# Patient Record
Sex: Male | Born: 1975 | Race: White | Hispanic: No | Marital: Married | Smoking: Former smoker
Health system: Southern US, Community
[De-identification: ages and names within clinical notes are randomized; demographics above are authoritative.]

## PROBLEM LIST (undated history)

## (undated) DIAGNOSIS — F411 Generalized anxiety disorder: Secondary | ICD-10-CM

## (undated) DIAGNOSIS — K219 Gastro-esophageal reflux disease without esophagitis: Secondary | ICD-10-CM

## (undated) HISTORY — PX: TONSILLECTOMY: SUR1361

## (undated) HISTORY — PX: APPENDECTOMY: SHX54

---

## 2012-01-23 ENCOUNTER — Emergency Department (HOSPITAL_COMMUNITY)
Admission: EM | Admit: 2012-01-23 | Discharge: 2012-01-23 | Disposition: A | Discharge status: Home / Self Care | Payer: BC Managed Care – PPO | Source: Home / Self Care

## 2012-01-23 ENCOUNTER — Encounter (HOSPITAL_COMMUNITY): Payer: Self-pay | Admitting: *Deleted

## 2012-01-23 DIAGNOSIS — R1013 Epigastric pain: Secondary | ICD-10-CM

## 2012-01-23 HISTORY — DX: Generalized anxiety disorder: F41.1

## 2012-01-23 HISTORY — DX: Gastro-esophageal reflux disease without esophagitis: K21.9

## 2012-01-23 NOTE — ED Provider Notes (Signed)
History     CSN: [id number]  Arrival date & time 01/23/12  1937   None     Chief Complaint  Patient presents with  . Abdominal Pain    (Consider location/radiation/quality/duration/timing/severity/associated sxs/prior treatment) HPI Comments: Pleasant 36 year old male who has been experiencing sharp abdominal pain for 2 days. It is intermittent it may last anywhere between 15 and 20 minutes to 30 minutes or an hour. He states time varies is often worse after eating. He has mild nausea but no vomiting he does have a history of reflux and takes Nexium. Heating light foods though and fat tends to cause no pain or less discomfort. However when heating at [employer] this caused a great amount of epigastric pain. The pain is sudden sharp in nature it is located in the epigastrium with slight radiation to the left. When the pain abates he feels well. He denies having fever chills, malaise. He does admit to having loose stools in the past couple of days, approximately 6 yesterday in 5-6 loose stools today. He is completely asymptomatic at this time and appears quite well.   Past Medical History  Diagnosis Date  . GERD (gastroesophageal reflux disease)   . Anxiety, generalized     Past Surgical History  Procedure Date  . Appendectomy   . Tonsillectomy     Family History  Problem Relation Age of Onset  . [name] Parkinson White syndrome Brother   . Heart attack Other     History  Substance Use Topics  . Smoking status: Former Games developer  . Smokeless tobacco: Not on file  . Alcohol Use: Yes     Comment: occasional      Review of Systems  Constitutional: Negative.   HENT: Negative.   Respiratory: Negative.   Cardiovascular: Negative.   Gastrointestinal:       As per history of present illness  Genitourinary: Negative.   Musculoskeletal: Negative.   Skin: Negative.   Neurological: Negative.   Psychiatric/Behavioral: Negative.     Allergies  Review of patient's allergies  indicates no known allergies.  Home Medications   Current Outpatient Rx  Name  Route  Sig  Dispense  Refill  . ESCITALOPRAM OXALATE 10 MG PO TABS   Oral   Take 10 mg by mouth daily.         Marland Kitchen ESOMEPRAZOLE MAGNESIUM 40 MG PO CPDR   Oral   Take 40 mg by mouth daily before breakfast.         . LORATADINE 10 MG PO CAPS   Oral   Take by mouth.         . MENS MULTIVITAMIN PLUS PO TABS   Oral   Take by mouth.           BP 130/85  Pulse 86  Temp 98.2 F (36.8 C) (Oral)  Resp 16  SpO2 97%  Physical Exam  Constitutional: He is oriented to person, place, and time. He appears well-developed and well-nourished. No distress.  Eyes: Conjunctivae normal and EOM are normal.  Neck: Normal range of motion. Neck supple.  Cardiovascular: Normal rate and normal heart sounds.   Pulmonary/Chest: Effort normal. No respiratory distress. He has no wheezes.  Abdominal: Soft. He exhibits no distension and no mass. There is tenderness. There is no rebound and no guarding.       Percussion reveals tympany and hyperresonance. There is moderate tenderness over the epigastrium and right upper quadrant. Negative Murphy sign  Musculoskeletal: He exhibits no edema and  no tenderness.  Neurological: He is alert and oriented to person, place, and time.  Skin: Skin is warm and dry. No rash noted. No erythema.  Psychiatric: He has a normal mood and affect.    ED Course  Procedures (including critical care time)  Labs Reviewed - No data to display No results found.   1. Epigastric abdominal pain       MDM  Differential diagnosis would be gastritis or, cholecystitis, biliary dyskinesia or obstruction,.. pancreatitis would be less likely. He is completely asymptomatic at this time he is instructed to eat foods low-fat and in the meantime if he develops severe pain he is to go to the emergency department. He will also call his PCP tomorrow to have him referred to either a gastroenterologist or  have an abdominal ultrasound. Continue taking her Nexium daily, he was discharged in a stable and asymptomatic condition.          Hayden Rasmussen, NP 01/23/12 2228

## 2012-01-23 NOTE — ED Notes (Signed)
C/o mid upper abdominal pain onset Tues. 11/12. Pain worse after eating.  Pain comes and goes and  He rates it 7/10.  Has had some nausea but no vomiting.  Had loose bowel movements x 6 yesterday  and 4 x today.   No chills or fever.  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

## 2012-01-24 NOTE — ED Provider Notes (Signed)
Medical screening examination/treatment/procedure(s) were performed by resident physician or non-physician practitioner and as supervising physician I was immediately available for consultation/collaboration.   Barkley Bruns MD.    Linna Hoff, MD 01/24/12 [phone]

## 2017-07-07 ENCOUNTER — Other Ambulatory Visit: Payer: Self-pay | Admitting: Gastroenterology

## 2017-07-07 DIAGNOSIS — K219 Gastro-esophageal reflux disease without esophagitis: Secondary | ICD-10-CM | POA: Diagnosis not present

## 2017-07-07 DIAGNOSIS — R1010 Upper abdominal pain, unspecified: Secondary | ICD-10-CM | POA: Diagnosis not present

## 2017-07-17 ENCOUNTER — Ambulatory Visit
Admission: RE | Admit: 2017-07-17 | Discharge: 2017-07-17 | Disposition: A | Discharge status: Home / Self Care | Payer: Self-pay | Source: Ambulatory Visit | Attending: Gastroenterology | Admitting: Gastroenterology

## 2017-07-17 DIAGNOSIS — R1010 Upper abdominal pain, unspecified: Secondary | ICD-10-CM

## 2017-08-01 DIAGNOSIS — K319 Disease of stomach and duodenum, unspecified: Secondary | ICD-10-CM | POA: Diagnosis not present

## 2017-08-01 DIAGNOSIS — K219 Gastro-esophageal reflux disease without esophagitis: Secondary | ICD-10-CM | POA: Diagnosis not present

## 2017-08-01 DIAGNOSIS — K449 Diaphragmatic hernia without obstruction or gangrene: Secondary | ICD-10-CM | POA: Diagnosis not present

## 2017-08-06 DIAGNOSIS — K319 Disease of stomach and duodenum, unspecified: Secondary | ICD-10-CM | POA: Diagnosis not present

## 2017-08-06 DIAGNOSIS — K219 Gastro-esophageal reflux disease without esophagitis: Secondary | ICD-10-CM | POA: Diagnosis not present

## 2018-03-24 DIAGNOSIS — F411 Generalized anxiety disorder: Secondary | ICD-10-CM | POA: Diagnosis not present

## 2018-03-31 DIAGNOSIS — Z1389 Encounter for screening for other disorder: Secondary | ICD-10-CM | POA: Diagnosis not present

## 2018-03-31 DIAGNOSIS — R7301 Impaired fasting glucose: Secondary | ICD-10-CM | POA: Diagnosis not present

## 2018-03-31 DIAGNOSIS — R7309 Other abnormal glucose: Secondary | ICD-10-CM | POA: Diagnosis not present

## 2018-03-31 DIAGNOSIS — Z0000 Encounter for general adult medical examination without abnormal findings: Secondary | ICD-10-CM | POA: Diagnosis not present

## 2018-03-31 DIAGNOSIS — E663 Overweight: Secondary | ICD-10-CM | POA: Diagnosis not present

## 2018-03-31 DIAGNOSIS — K219 Gastro-esophageal reflux disease without esophagitis: Secondary | ICD-10-CM | POA: Diagnosis not present

## 2018-03-31 DIAGNOSIS — F329 Major depressive disorder, single episode, unspecified: Secondary | ICD-10-CM | POA: Diagnosis not present

## 2018-03-31 DIAGNOSIS — Z6829 Body mass index (BMI) 29.0-29.9, adult: Secondary | ICD-10-CM | POA: Diagnosis not present

## 2018-04-02 DIAGNOSIS — F411 Generalized anxiety disorder: Secondary | ICD-10-CM | POA: Diagnosis not present

## 2018-04-07 DIAGNOSIS — F411 Generalized anxiety disorder: Secondary | ICD-10-CM | POA: Diagnosis not present

## 2018-04-14 DIAGNOSIS — F411 Generalized anxiety disorder: Secondary | ICD-10-CM | POA: Diagnosis not present

## 2018-04-23 DIAGNOSIS — F411 Generalized anxiety disorder: Secondary | ICD-10-CM | POA: Diagnosis not present

## 2018-04-29 DIAGNOSIS — F411 Generalized anxiety disorder: Secondary | ICD-10-CM | POA: Diagnosis not present

## 2018-05-04 DIAGNOSIS — F411 Generalized anxiety disorder: Secondary | ICD-10-CM | POA: Diagnosis not present

## 2018-05-12 DIAGNOSIS — F411 Generalized anxiety disorder: Secondary | ICD-10-CM | POA: Diagnosis not present

## 2018-05-18 DIAGNOSIS — F411 Generalized anxiety disorder: Secondary | ICD-10-CM | POA: Diagnosis not present

## 2018-05-26 DIAGNOSIS — F411 Generalized anxiety disorder: Secondary | ICD-10-CM | POA: Diagnosis not present

## 2018-06-02 DIAGNOSIS — F411 Generalized anxiety disorder: Secondary | ICD-10-CM | POA: Diagnosis not present

## 2018-06-09 DIAGNOSIS — F411 Generalized anxiety disorder: Secondary | ICD-10-CM | POA: Diagnosis not present

## 2018-06-16 DIAGNOSIS — F411 Generalized anxiety disorder: Secondary | ICD-10-CM | POA: Diagnosis not present

## 2018-06-23 DIAGNOSIS — F411 Generalized anxiety disorder: Secondary | ICD-10-CM | POA: Diagnosis not present

## 2018-06-26 DIAGNOSIS — K219 Gastro-esophageal reflux disease without esophagitis: Secondary | ICD-10-CM | POA: Diagnosis not present

## 2018-06-26 DIAGNOSIS — I10 Essential (primary) hypertension: Secondary | ICD-10-CM | POA: Diagnosis not present

## 2018-06-26 DIAGNOSIS — Z6829 Body mass index (BMI) 29.0-29.9, adult: Secondary | ICD-10-CM | POA: Diagnosis not present

## 2018-06-26 DIAGNOSIS — E663 Overweight: Secondary | ICD-10-CM | POA: Diagnosis not present

## 2018-06-26 DIAGNOSIS — Z1389 Encounter for screening for other disorder: Secondary | ICD-10-CM | POA: Diagnosis not present

## 2018-06-30 DIAGNOSIS — F411 Generalized anxiety disorder: Secondary | ICD-10-CM | POA: Diagnosis not present

## 2018-07-07 DIAGNOSIS — F411 Generalized anxiety disorder: Secondary | ICD-10-CM | POA: Diagnosis not present

## 2018-07-14 DIAGNOSIS — F411 Generalized anxiety disorder: Secondary | ICD-10-CM | POA: Diagnosis not present

## 2018-07-21 DIAGNOSIS — F411 Generalized anxiety disorder: Secondary | ICD-10-CM | POA: Diagnosis not present

## 2018-07-28 DIAGNOSIS — F411 Generalized anxiety disorder: Secondary | ICD-10-CM | POA: Diagnosis not present

## 2018-08-13 DIAGNOSIS — F411 Generalized anxiety disorder: Secondary | ICD-10-CM | POA: Diagnosis not present

## 2018-08-24 DIAGNOSIS — F411 Generalized anxiety disorder: Secondary | ICD-10-CM | POA: Diagnosis not present

## 2018-09-07 DIAGNOSIS — F411 Generalized anxiety disorder: Secondary | ICD-10-CM | POA: Diagnosis not present

## 2018-09-22 DIAGNOSIS — Z20828 Contact with and (suspected) exposure to other viral communicable diseases: Secondary | ICD-10-CM | POA: Diagnosis not present

## 2018-10-01 DIAGNOSIS — F411 Generalized anxiety disorder: Secondary | ICD-10-CM | POA: Diagnosis not present

## 2018-10-08 DIAGNOSIS — F411 Generalized anxiety disorder: Secondary | ICD-10-CM | POA: Diagnosis not present

## 2018-10-19 DIAGNOSIS — F411 Generalized anxiety disorder: Secondary | ICD-10-CM | POA: Diagnosis not present

## 2018-10-27 DIAGNOSIS — I10 Essential (primary) hypertension: Secondary | ICD-10-CM | POA: Diagnosis not present

## 2018-10-27 DIAGNOSIS — F329 Major depressive disorder, single episode, unspecified: Secondary | ICD-10-CM | POA: Diagnosis not present

## 2018-11-05 DIAGNOSIS — F411 Generalized anxiety disorder: Secondary | ICD-10-CM | POA: Diagnosis not present

## 2018-11-23 DIAGNOSIS — F411 Generalized anxiety disorder: Secondary | ICD-10-CM | POA: Diagnosis not present

## 2018-12-07 DIAGNOSIS — F411 Generalized anxiety disorder: Secondary | ICD-10-CM | POA: Diagnosis not present

## 2018-12-14 DIAGNOSIS — F411 Generalized anxiety disorder: Secondary | ICD-10-CM | POA: Diagnosis not present

## 2018-12-31 DIAGNOSIS — F411 Generalized anxiety disorder: Secondary | ICD-10-CM | POA: Diagnosis not present

## 2019-01-15 ENCOUNTER — Other Ambulatory Visit: Payer: Self-pay

## 2019-01-15 DIAGNOSIS — Z20822 Contact with and (suspected) exposure to covid-19: Secondary | ICD-10-CM

## 2019-01-15 DIAGNOSIS — F411 Generalized anxiety disorder: Secondary | ICD-10-CM | POA: Diagnosis not present

## 2019-01-16 LAB — NOVEL CORONAVIRUS, NAA: SARS-CoV-2, NAA: NOT DETECTED

## 2019-01-20 ENCOUNTER — Other Ambulatory Visit: Payer: Self-pay

## 2019-01-20 DIAGNOSIS — Z20822 Contact with and (suspected) exposure to covid-19: Secondary | ICD-10-CM

## 2019-01-22 LAB — NOVEL CORONAVIRUS, NAA: SARS-CoV-2, NAA: DETECTED — AB

## 2019-01-25 DIAGNOSIS — F411 Generalized anxiety disorder: Secondary | ICD-10-CM | POA: Diagnosis not present

## 2019-02-11 DIAGNOSIS — F411 Generalized anxiety disorder: Secondary | ICD-10-CM | POA: Diagnosis not present

## 2019-02-22 IMAGING — US US ABDOMEN LIMITED
1 series · 14 of 25 positions shown · non-contrast
Comparison: None.

CLINICAL DATA: Upper abdominal pain for 2 months.

EXAM:
ULTRASOUND ABDOMEN LIMITED RIGHT UPPER QUADRANT

[Series 1: us abdomen limited · 0.22mm/px · 14 of 45 slices shown]
[im 1/45]
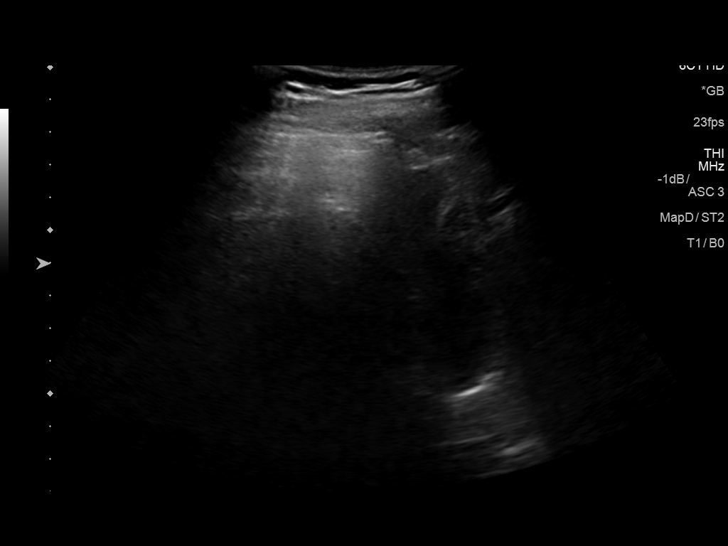
[im 4/45]
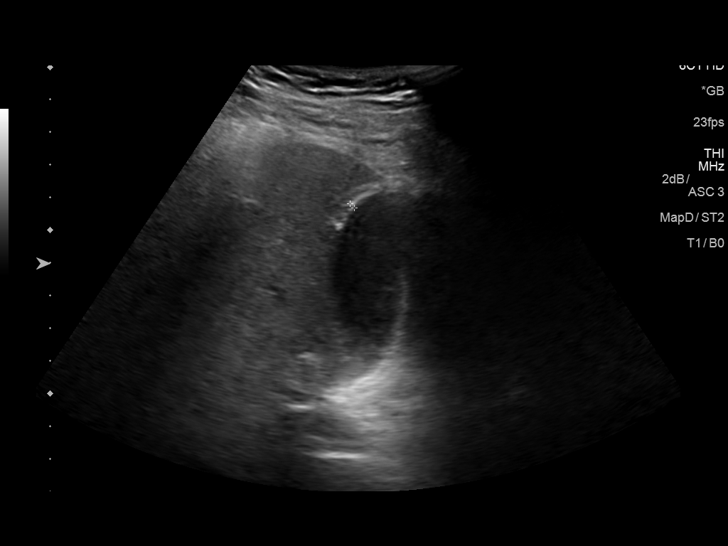
[im 8/45]
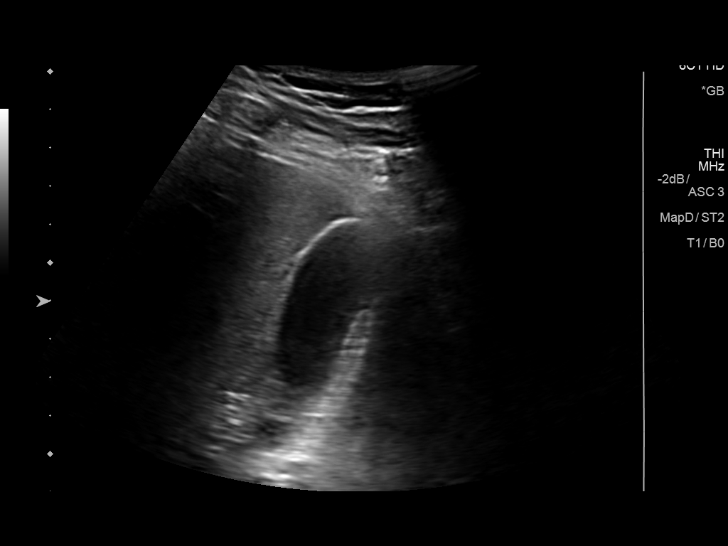
[im 12/45]
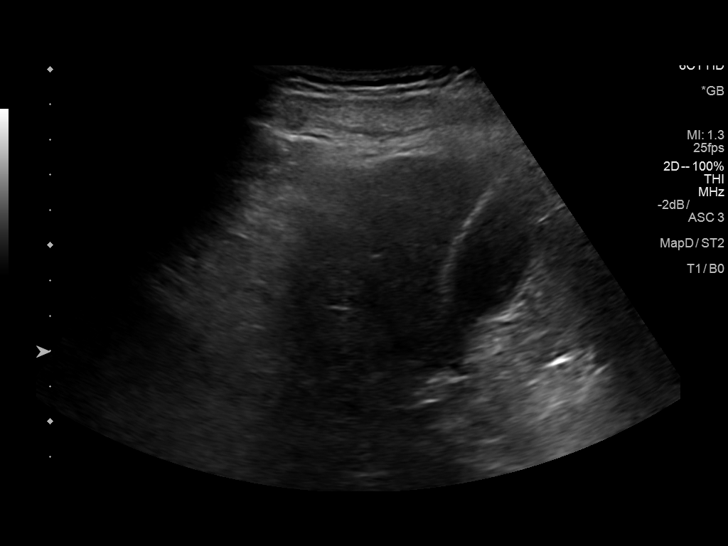
[im 15/45]
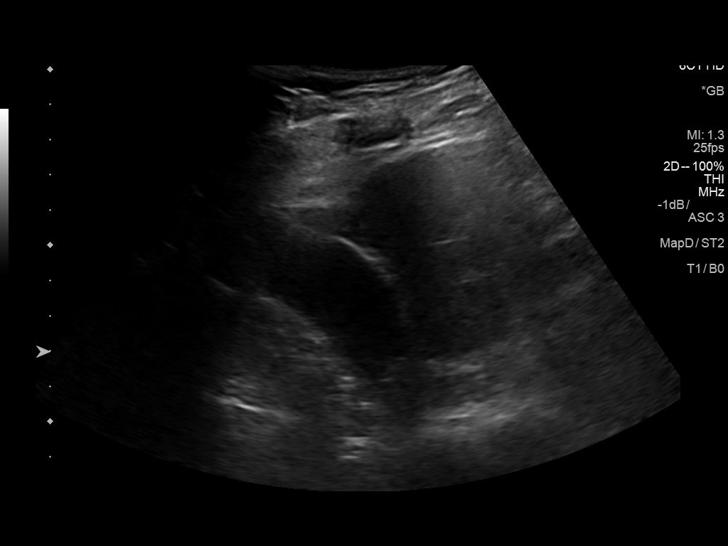
[im 17/45]
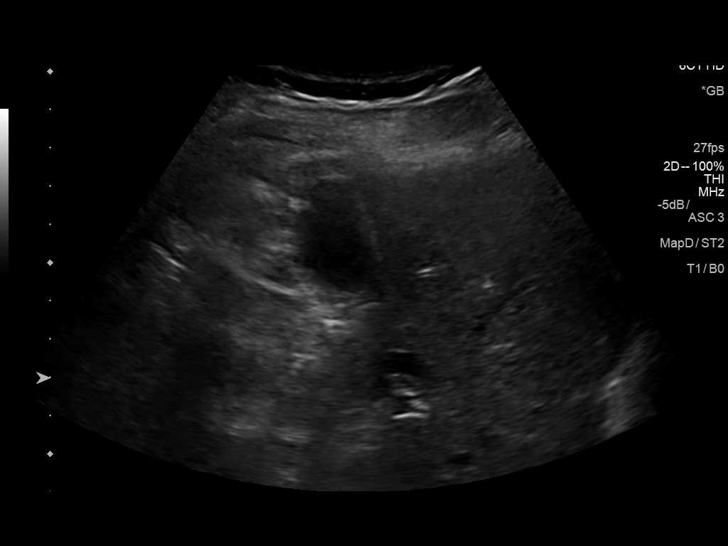
[im 21/45]
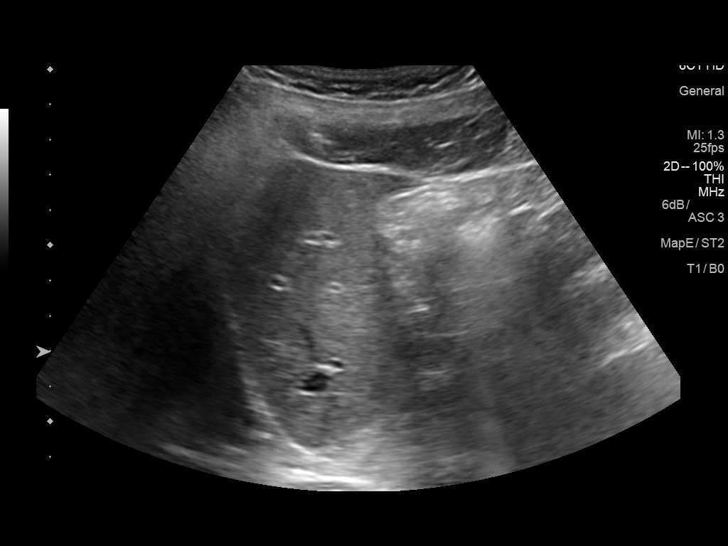
[im 24/45]
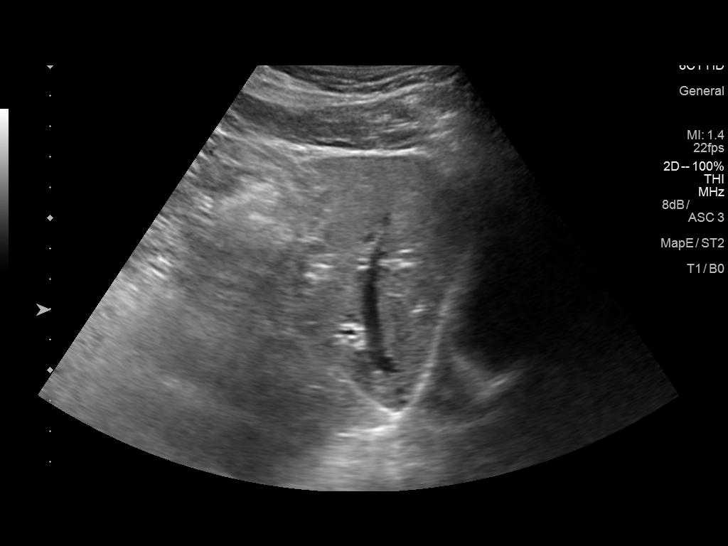
[im 28/45]
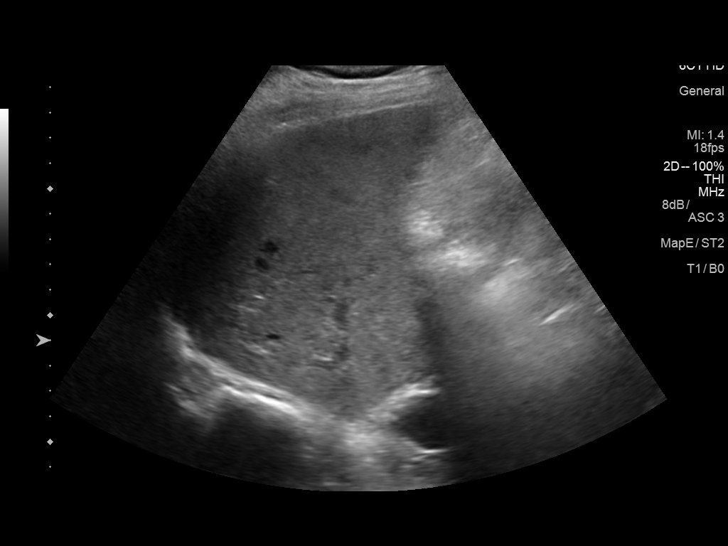
[im 30/45]
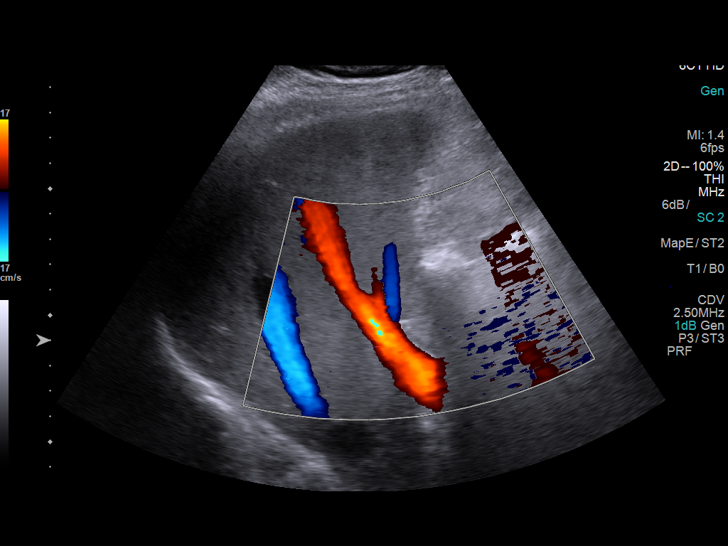
[im 34/45]
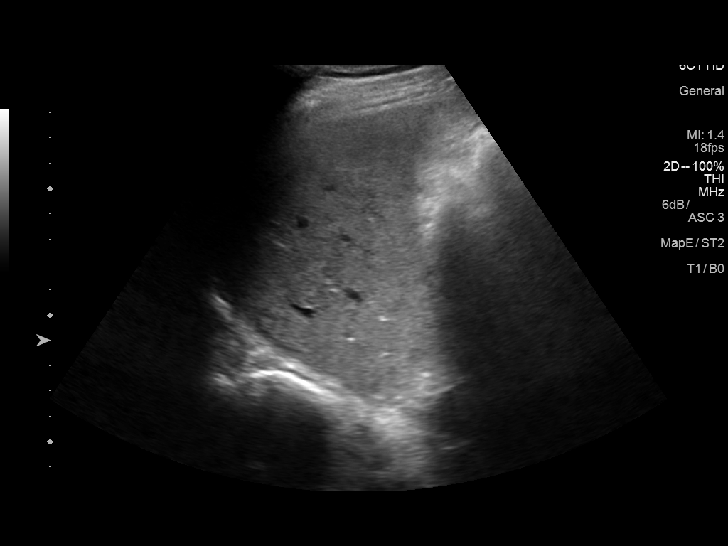
[im 37/45]
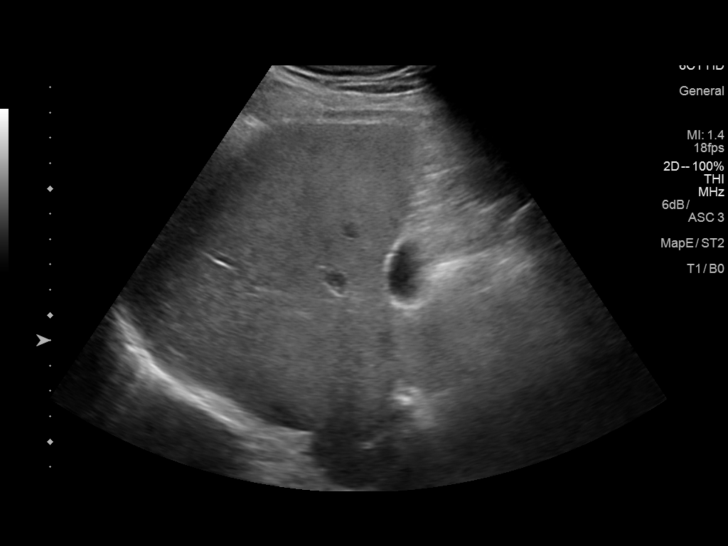
[im 41/45]
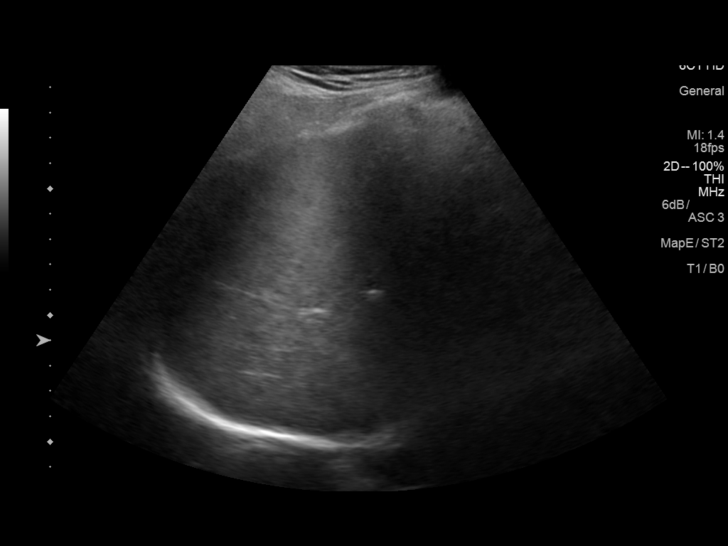
[im 45/45]
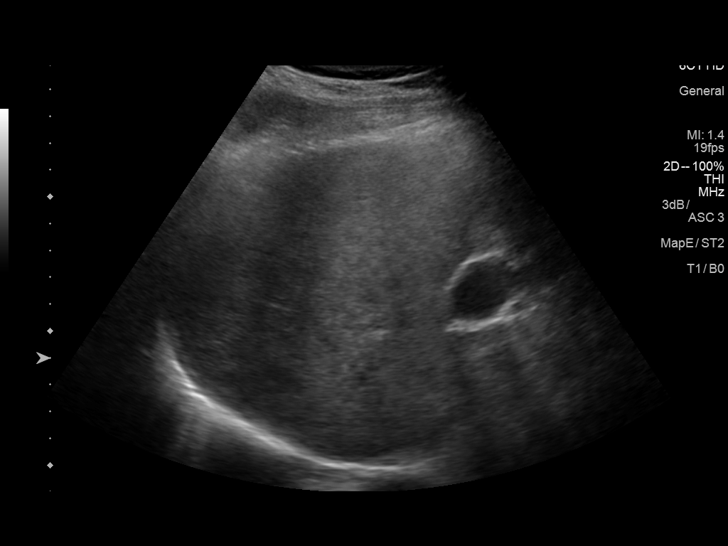

[14 of 25 positions shown; findings below may reference images not displayed]

FINDINGS: Gallbladder:

No gallstones or wall thickening visualized. No sonographic Murphy
sign noted by sonographer.

Common bile duct:

Diameter: 3.9 mm

Liver:

No focal lesion identified. Within normal limits in parenchymal
echogenicity. Portal vein is patent on color Doppler imaging with
normal direction of blood flow towards the liver.
IMPRESSION: Normal right upper quadrant ultrasound.

## 2019-02-25 DIAGNOSIS — F411 Generalized anxiety disorder: Secondary | ICD-10-CM | POA: Diagnosis not present

## 2019-03-18 DIAGNOSIS — F411 Generalized anxiety disorder: Secondary | ICD-10-CM | POA: Diagnosis not present

## 2019-04-01 DIAGNOSIS — F411 Generalized anxiety disorder: Secondary | ICD-10-CM | POA: Diagnosis not present

## 2019-04-15 DIAGNOSIS — F411 Generalized anxiety disorder: Secondary | ICD-10-CM | POA: Diagnosis not present

## 2019-04-30 DIAGNOSIS — E6609 Other obesity due to excess calories: Secondary | ICD-10-CM | POA: Diagnosis not present

## 2019-04-30 DIAGNOSIS — K219 Gastro-esophageal reflux disease without esophagitis: Secondary | ICD-10-CM | POA: Diagnosis not present

## 2019-04-30 DIAGNOSIS — I10 Essential (primary) hypertension: Secondary | ICD-10-CM | POA: Diagnosis not present

## 2019-04-30 DIAGNOSIS — Z6830 Body mass index (BMI) 30.0-30.9, adult: Secondary | ICD-10-CM | POA: Diagnosis not present

## 2019-04-30 DIAGNOSIS — Z0001 Encounter for general adult medical examination with abnormal findings: Secondary | ICD-10-CM | POA: Diagnosis not present

## 2019-04-30 DIAGNOSIS — R0609 Other forms of dyspnea: Secondary | ICD-10-CM | POA: Diagnosis not present

## 2019-04-30 DIAGNOSIS — Z1389 Encounter for screening for other disorder: Secondary | ICD-10-CM | POA: Diagnosis not present

## 2019-05-05 ENCOUNTER — Other Ambulatory Visit (HOSPITAL_COMMUNITY): Payer: Self-pay | Admitting: Family Medicine

## 2019-05-05 ENCOUNTER — Ambulatory Visit (HOSPITAL_COMMUNITY)
Admission: RE | Admit: 2019-05-05 | Discharge: 2019-05-05 | Disposition: A | Discharge status: Home / Self Care | Payer: BC Managed Care – PPO | Source: Ambulatory Visit | Attending: Family Medicine | Admitting: Family Medicine

## 2019-05-05 ENCOUNTER — Other Ambulatory Visit: Payer: Self-pay

## 2019-05-05 DIAGNOSIS — R0609 Other forms of dyspnea: Secondary | ICD-10-CM

## 2019-05-05 DIAGNOSIS — R0602 Shortness of breath: Secondary | ICD-10-CM | POA: Diagnosis not present

## 2019-05-05 DIAGNOSIS — F411 Generalized anxiety disorder: Secondary | ICD-10-CM | POA: Diagnosis not present

## 2019-06-03 DIAGNOSIS — F411 Generalized anxiety disorder: Secondary | ICD-10-CM | POA: Diagnosis not present

## 2019-06-07 DIAGNOSIS — F411 Generalized anxiety disorder: Secondary | ICD-10-CM | POA: Diagnosis not present

## 2019-06-30 DIAGNOSIS — F411 Generalized anxiety disorder: Secondary | ICD-10-CM | POA: Diagnosis not present

## 2019-07-08 DIAGNOSIS — F411 Generalized anxiety disorder: Secondary | ICD-10-CM | POA: Diagnosis not present

## 2019-07-22 DIAGNOSIS — F411 Generalized anxiety disorder: Secondary | ICD-10-CM | POA: Diagnosis not present

## 2019-08-06 DIAGNOSIS — F411 Generalized anxiety disorder: Secondary | ICD-10-CM | POA: Diagnosis not present

## 2019-09-02 DIAGNOSIS — F411 Generalized anxiety disorder: Secondary | ICD-10-CM | POA: Diagnosis not present

## 2020-07-02 IMAGING — DX DG CHEST 2V
2 series · 2 of 2 positions shown · non-contrast
Comparison: None.

CLINICAL DATA: Shortness of breath

EXAM:
CHEST - 2 VIEW

[chest pa]
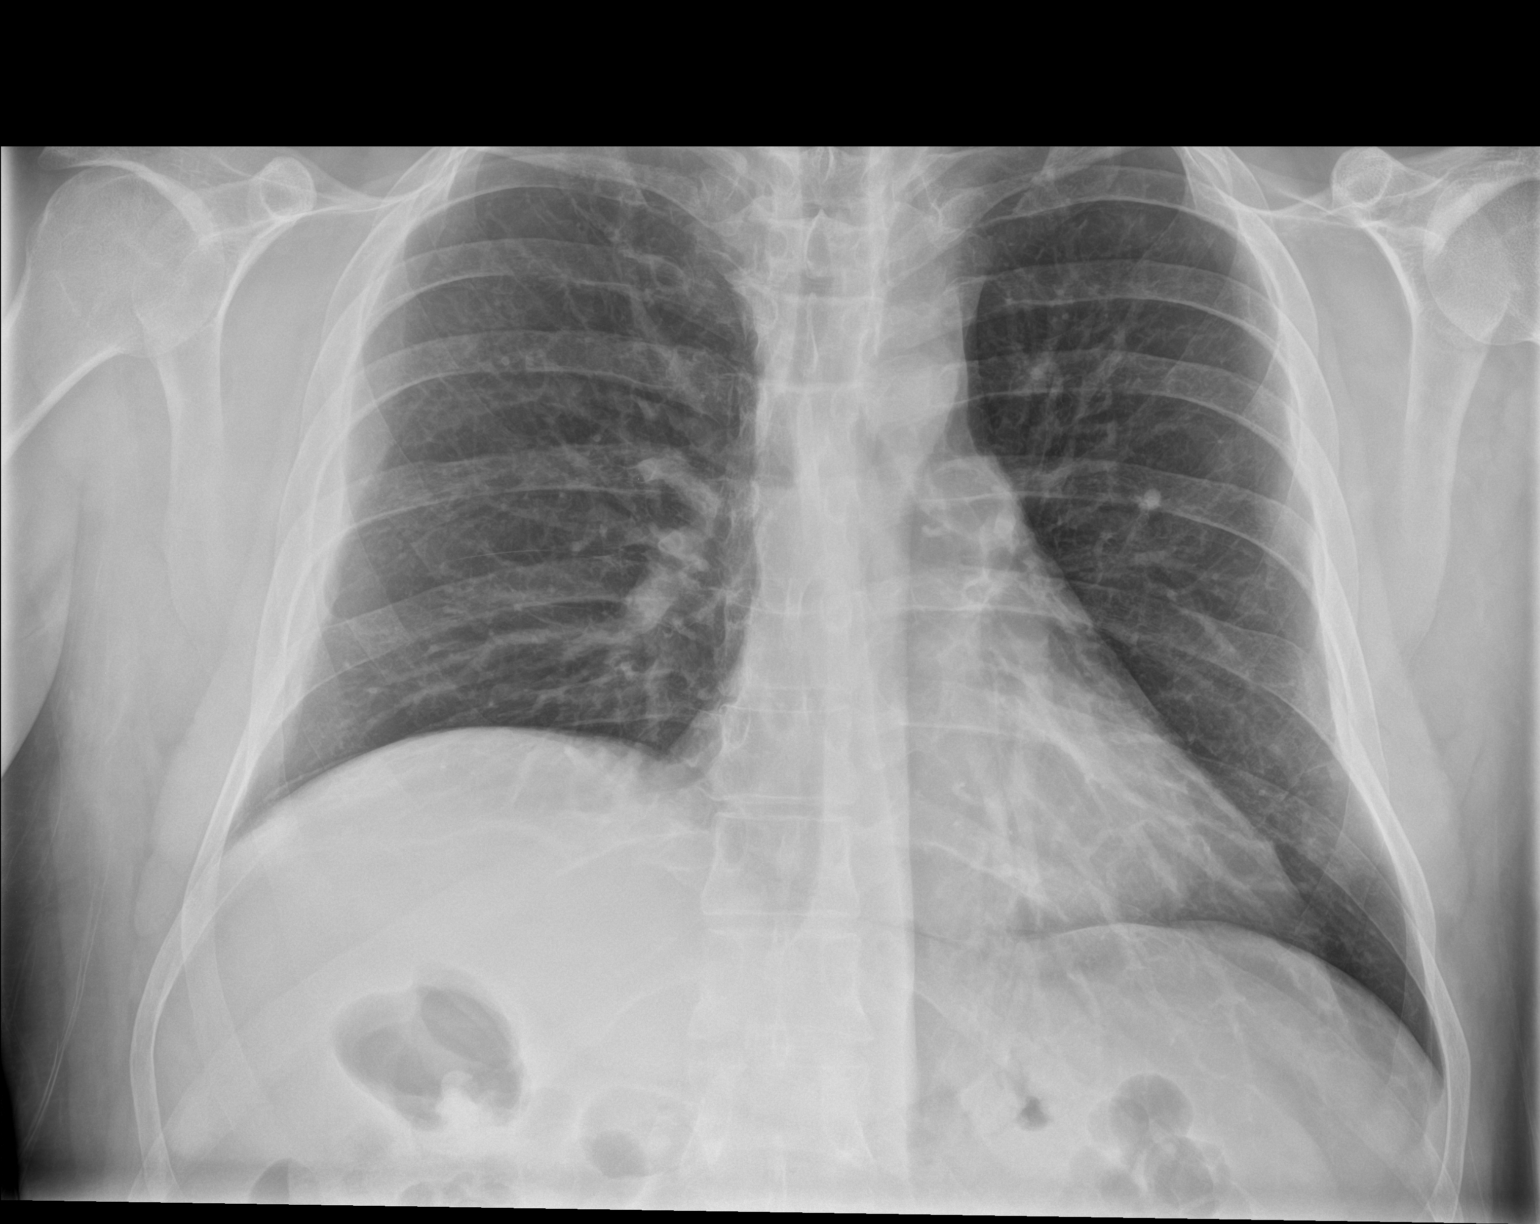

[chest lat]
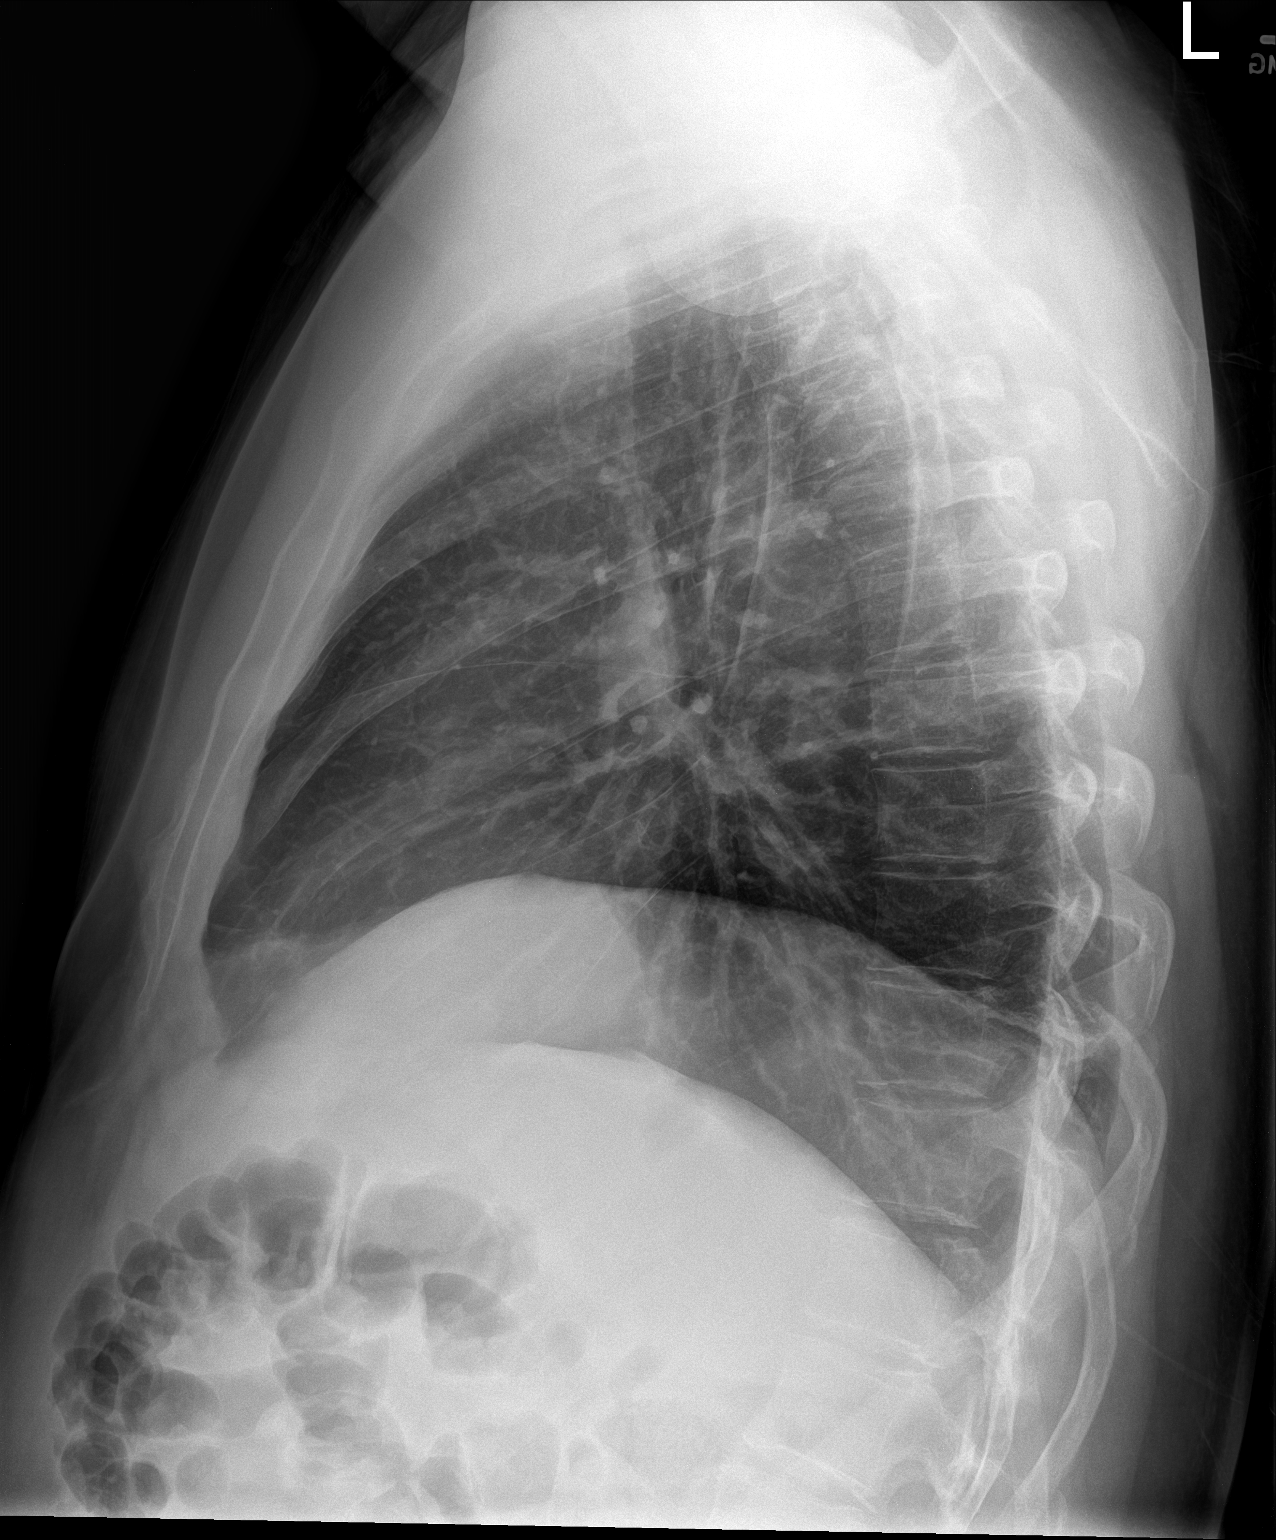

[2 of 2 positions shown; findings below may reference images not displayed]

FINDINGS: There is mild elevation of the right hemidiaphragm with slight
bibasilar atelectasis. Lungs elsewhere clear. Heart size and
pulmonary vascularity are normal. No adenopathy. No bone lesions.
IMPRESSION: Mild elevation of right hemidiaphragm. Slight bibasilar atelectasis.
Lungs elsewhere clear. Cardiac silhouette within normal limits.

## 2020-12-03 NOTE — Progress Notes (Addendum)
CARDIOLOGY CONSULT NOTE       Patient ID: Alfred Hickman MRN: [id number] DOB/AGE: June 03, 1975 45 y.o.  Admit date: (Not on file) Referring Physician: Jean Rosenthal Primary Physician: Avis Epley, PA-C Primary Cardiologist: New Reason for Consultation: Chest pain  Active Problems:   * No active hospital problems. *   HPI:  45 y.o. referred by [name] PA for chest pain. History of GAD, GERD. Has a brother with WPW.  Chest pain 2-3 weeks mild substernal no radiation related to exertion No other obvious alleviating or aggravating factors No associated diaphoresis dyspnea or palpitations No syncope LDL is 108 hct 42.6 Cr 1.03 J 4,4 TSH 0.869   ECG done 10/18/20 NSR rate 78 normal no signs of pericarditis   He had an episode of atypical left neck/shoulder and chest pain after golfing 18 at [employer] lightheaded Better with hydration.   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[employer] [place] fan lives near our nurse Burna Cash in same developement  ROS All other systems reviewed and negative except as noted above  Past Medical History:  Diagnosis Date   Anxiety, generalized    GERD (gastroesophageal reflux disease)     Family History  Problem Relation Age of Onset   [name] Parkinson White syndrome Brother    Heart attack Other     Social History   Socioeconomic History   Marital status: Married    Spouse name: Not on file   Number of children: Not on file   Years of education: Not on file   Highest education level: Not on file  Occupational History   Not on file  Tobacco Use   Smoking status: Former   Smokeless tobacco: Never  Vaping Use   Vaping Use: Never used  Substance and Sexual Activity   Alcohol use: Yes    Comment: occasional   Drug use: No   Sexual activity: Not on file  Other Topics Concern   Not on file  Social History Narrative   Not on file   Social  Determinants of Health   Financial Resource Strain: Not on file  Food Insecurity: Not on file  Transportation Needs: Not on file  Physical Activity: Not on file  Stress: Not on file  Social Connections: Not on file  Intimate Partner Violence: Not on file    Past Surgical History:  Procedure Laterality Date   APPENDECTOMY     TONSILLECTOMY        Current Outpatient Medications:    escitalopram (LEXAPRO) 10 MG tablet, Take 10 mg by mouth daily., Disp: , Rfl:    esomeprazole (NEXIUM) 40 MG capsule, Take 40 mg by mouth daily before breakfast., Disp: , Rfl:    Loratadine 10 MG CAPS, Take by mouth., Disp: , Rfl:    Multiple Vitamins-Minerals (MENS MULTIVITAMIN PLUS) TABS, Take by mouth., Disp: , Rfl:    olmesartan (BENICAR) 20 MG tablet, Take 20 mg by mouth daily., Disp: , Rfl:     Physical Exam: Blood pressure 114/80, pulse 76, height 5' 10.5" (1.791 m), weight 96.2 kg, SpO2 99 %.    Affect appropriate Healthy:  appears stated age HEENT: normal Neck supple with no adenopathy JVP normal no bruits no thyromegaly Lungs clear with no wheezing and good diaphragmatic motion Heart:  S1/S2 no murmur, no rub, gallop or click PMI normal Abdomen: benighn, BS positve, no tenderness, no AAA no bruit.  No HSM or HJR Distal pulses intact with no bruits No edema Neuro non-focal Skin warm and dry No muscular weakness   Labs:  No results found for: WBC, HGB, HCT, MCV, PLT No results for input(s): NA, K, CL, CO2, BUN, CREATININE, CALCIUM, PROT, BILITOT, ALKPHOS, ALT, AST, GLUCOSE in the last 168 hours.  Invalid input(s): LABALBU No results found for: CKTOTAL, CKMB, CKMBINDEX, TROPONINI No results found for: CHOL No results found for: HDL No results found for: LDLCALC No results found for: TRIG No results found for: CHOLHDL No results found for: LDLDIRECT    Radiology: No results found.  EKG: See HPI    ASSESSMENT AND PLAN:   Chest Pain: atypical isolated. Calcium socre 0 and  normal ECG no need for further w/u  GAD:  on Lexapro f/u primary  GERD: on Nexium discussed low carb diet    F/U PRN   Signed: Charlton Haws 12/08/2020, 10:08 AM

## 2020-12-08 ENCOUNTER — Other Ambulatory Visit: Payer: Self-pay

## 2020-12-08 ENCOUNTER — Encounter: Payer: Self-pay | Admitting: Cardiovascular Disease

## 2020-12-08 ENCOUNTER — Ambulatory Visit (INDEPENDENT_AMBULATORY_CARE_PROVIDER_SITE_OTHER): Payer: No Typology Code available for payment source | Admitting: Cardiovascular Disease

## 2020-12-08 VITALS — BP 114/80 | HR 76 | Ht 70.5 in | Wt 212.0 lb

## 2020-12-08 DIAGNOSIS — F411 Generalized anxiety disorder: Secondary | ICD-10-CM | POA: Diagnosis not present

## 2020-12-08 DIAGNOSIS — K219 Gastro-esophageal reflux disease without esophagitis: Secondary | ICD-10-CM

## 2020-12-08 DIAGNOSIS — R079 Chest pain, unspecified: Secondary | ICD-10-CM | POA: Diagnosis not present

## 2020-12-08 NOTE — Patient Instructions (Signed)
Medication Instructions:  Your physician recommends that you continue on your current medications as directed. Please refer to the Current Medication list given to you today.  *If you need a refill on your cardiac medications before your next appointment, please call your pharmacy*   Lab Work: NONE   If you have labs (blood work) drawn today and your tests are completely normal, you will receive your results only by: MyChart Message (if you have MyChart) OR A paper copy in the mail If you have any lab test that is abnormal or we need to change your treatment, we will call you to review the results.   Testing/Procedures: NONE    Follow-Up: At [facility], you and your health needs are our priority.  As part of our continuing mission to provide you with exceptional heart care, we have created designated Provider Care Teams.  These Care Teams include your primary Cardiologist (physician) and Advanced Practice Providers (APPs -  Physician Assistants and Nurse Practitioners) who all work together to provide you with the care you need, when you need it.  We recommend signing up for the patient portal called "MyChart".  Sign up information is provided on this After Visit Summary.  MyChart is used to connect with patients for Virtual Visits (Telemedicine).  Patients are able to view lab/test results, encounter notes, upcoming appointments, etc.  Non-urgent messages can be sent to your provider as well.   To learn more about what you can do with MyChart, go to [email or web address].    Your next appointment:    As Needed   The format for your next appointment:   In Person  Provider:   Peter Nishan, MD   Other Instructions Thank you for choosing [facility]!    

## 2023-05-21 ENCOUNTER — Encounter: Payer: Self-pay | Admitting: *Deleted
